# Patient Record
Sex: Male | Born: 1985 | Hispanic: Yes | Marital: Single | Smoking: Never smoker
Health system: Southern US, Community
[De-identification: ages and names within clinical notes are randomized; demographics above are authoritative.]

---

## 2009-07-22 ENCOUNTER — Emergency Department: Payer: Self-pay | Admitting: Emergency Medicine

## 2010-10-17 ENCOUNTER — Emergency Department: Payer: Self-pay | Admitting: Emergency Medicine

## 2011-03-06 ENCOUNTER — Emergency Department: Payer: Self-pay | Admitting: Emergency Medicine
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
## 2012-03-29 ENCOUNTER — Emergency Department: Payer: Self-pay | Admitting: *Deleted

## 2014-06-05 ENCOUNTER — Emergency Department: Payer: Self-pay | Admitting: Emergency Medicine

## 2014-06-05 LAB — URINALYSIS, COMPLETE
BACTERIA: NONE SEEN
Bilirubin,UR: NEGATIVE
Blood: NEGATIVE
GLUCOSE, UR: NEGATIVE mg/dL (ref 0–75)
Ketone: NEGATIVE
Leukocyte Esterase: NEGATIVE
Nitrite: NEGATIVE
Ph: 5 (ref 4.5–8.0)
Protein: NEGATIVE
RBC,UR: 3 /HPF (ref 0–5)
Specific Gravity: 1.018 (ref 1.003–1.030)
Squamous Epithelial: NONE SEEN
WBC UR: 18 /HPF (ref 0–5)

## 2014-06-05 LAB — GC/CHLAMYDIA PROBE AMP

## 2015-09-06 ENCOUNTER — Emergency Department: Payer: Self-pay

## 2015-09-06 ENCOUNTER — Encounter: Payer: Self-pay | Admitting: Emergency Medicine

## 2015-09-06 ENCOUNTER — Emergency Department
Admission: EM | Admit: 2015-09-06 | Discharge: 2015-09-06 | Disposition: A | Discharge status: Home / Self Care | Payer: Self-pay | Attending: Emergency Medicine | Admitting: Emergency Medicine

## 2015-09-06 DIAGNOSIS — K920 Hematemesis: Secondary | ICD-10-CM

## 2015-09-06 DIAGNOSIS — F1110 Opioid abuse, uncomplicated: Secondary | ICD-10-CM | POA: Insufficient documentation

## 2015-09-06 DIAGNOSIS — K2921 Alcoholic gastritis with bleeding: Secondary | ICD-10-CM | POA: Insufficient documentation

## 2015-09-06 DIAGNOSIS — K2920 Alcoholic gastritis without bleeding: Secondary | ICD-10-CM

## 2015-09-06 DIAGNOSIS — R809 Proteinuria, unspecified: Secondary | ICD-10-CM | POA: Insufficient documentation

## 2015-09-06 LAB — CBC WITH DIFFERENTIAL/PLATELET
Basophils Absolute: 0 10*3/uL (ref 0–0.1)
Basophils Relative: 1 %
Eosinophils Absolute: 0.1 10*3/uL (ref 0–0.7)
Eosinophils Relative: 1 %
HEMATOCRIT: 47 % (ref 40.0–52.0)
HEMOGLOBIN: 16.2 g/dL (ref 13.0–18.0)
LYMPHS ABS: 1.9 10*3/uL (ref 1.0–3.6)
LYMPHS PCT: 34 %
MCH: 33 pg (ref 26.0–34.0)
MCHC: 34.4 g/dL (ref 32.0–36.0)
MCV: 95.8 fL (ref 80.0–100.0)
MONOS PCT: 11 %
Monocytes Absolute: 0.6 10*3/uL (ref 0.2–1.0)
NEUTROS ABS: 3 10*3/uL (ref 1.4–6.5)
NEUTROS PCT: 53 %
Platelets: 260 10*3/uL (ref 150–440)
RBC: 4.91 MIL/uL (ref 4.40–5.90)
RDW: 13.2 % (ref 11.5–14.5)
WBC: 5.7 10*3/uL (ref 3.8–10.6)

## 2015-09-06 LAB — APTT: aPTT: 30 seconds (ref 24–36)

## 2015-09-06 LAB — COMPREHENSIVE METABOLIC PANEL
ALT: 30 U/L (ref 17–63)
ANION GAP: 7 (ref 5–15)
AST: 23 U/L (ref 15–41)
Albumin: 4.5 g/dL (ref 3.5–5.0)
Alkaline Phosphatase: 73 U/L (ref 38–126)
BUN: 8 mg/dL (ref 6–20)
CHLORIDE: 105 mmol/L (ref 101–111)
CO2: 28 mmol/L (ref 22–32)
CREATININE: 1.05 mg/dL (ref 0.61–1.24)
Calcium: 9 mg/dL (ref 8.9–10.3)
GFR calc non Af Amer: >60 mL/min (ref >60)
Glucose, Bld: 95 mg/dL (ref 65–99)
Potassium: 4.1 mmol/L (ref 3.5–5.1)
SODIUM: 140 mmol/L (ref 135–145)
Total Bilirubin: 0.6 mg/dL (ref 0.3–1.2)
Total Protein: 7.8 g/dL (ref 6.5–8.1)

## 2015-09-06 LAB — URINALYSIS COMPLETE WITH MICROSCOPIC (ARMC ONLY)
Bilirubin Urine: NEGATIVE
Glucose, UA: NEGATIVE mg/dL
KETONES UR: NEGATIVE mg/dL
Leukocytes, UA: NEGATIVE
Nitrite: NEGATIVE
PH: 5 (ref 5.0–8.0)
PROTEIN: 30 mg/dL — AB
SQUAMOUS EPITHELIAL / LPF: NONE SEEN
Specific Gravity, Urine: 1.02 (ref 1.005–1.030)

## 2015-09-06 LAB — FIBRIN DERIVATIVES D-DIMER (ARMC ONLY): FIBRIN DERIVATIVES D-DIMER (ARMC): 120 (ref 0–499)

## 2015-09-06 LAB — PROTIME-INR
INR: 0.98
PROTHROMBIN TIME: 13.2 s (ref 11.4–15.0)

## 2015-09-06 LAB — URINE DRUG SCREEN, QUALITATIVE (ARMC ONLY)
AMPHETAMINES, UR SCREEN: NOT DETECTED
BENZODIAZEPINE, UR SCRN: NOT DETECTED
Barbiturates, Ur Screen: NOT DETECTED
Cannabinoid 50 Ng, Ur ~~LOC~~: NOT DETECTED
Cocaine Metabolite,Ur ~~LOC~~: NOT DETECTED
MDMA (Ecstasy)Ur Screen: NOT DETECTED
Methadone Scn, Ur: NOT DETECTED
Opiate, Ur Screen: POSITIVE — AB
PHENCYCLIDINE (PCP) UR S: NOT DETECTED
TRICYCLIC, UR SCREEN: NOT DETECTED

## 2015-09-06 LAB — LIPASE, BLOOD: Lipase: 21 U/L (ref 11–51)

## 2015-09-06 MED ORDER — PANTOPRAZOLE SODIUM 40 MG PO TBEC: 40.0000 mg | DELAYED_RELEASE_TABLET | Freq: Every day | ORAL | Status: AC

## 2015-09-06 MED ORDER — GI COCKTAIL ~~LOC~~
30.0000 mL | Freq: Once | ORAL | Status: AC
  Administered 2015-09-06: 30 mL via ORAL
  Filled 2015-09-06: qty 30

## 2015-09-06 MED ORDER — PANTOPRAZOLE SODIUM 40 MG IV SOLR
40.0000 mg | Freq: Once | INTRAVENOUS | Status: AC
  Administered 2015-09-06: 40 mg via INTRAVENOUS
  Filled 2015-09-06: qty 40

## 2015-09-06 MED ORDER — MORPHINE SULFATE (PF) 4 MG/ML IV SOLN
4.0000 mg | Freq: Once | INTRAVENOUS | Status: AC
  Administered 2015-09-06: 4 mg via INTRAVENOUS
  Filled 2015-09-06: qty 1

## 2015-09-06 MED ORDER — SODIUM CHLORIDE 0.9 % IV BOLUS (SEPSIS)
1000.0000 mL | Freq: Once | INTRAVENOUS | Status: AC
  Administered 2015-09-06: 1000 mL via INTRAVENOUS

## 2015-09-06 NOTE — Discharge Instructions (Signed)
Gastritis - Adultos   (Gastritis, Adult)   Gastrittis es la hinchazón e irritación (inflamación) del revestimiento interno del estómago. Si no recibe tratamiento, la gastritis puede causar sangrado y llagas.(úlceras) en el estómago.  CUIDADOS EN EL HOGAR   · Sólo tome los medicamentos según le indique el médico.  · Si le han recetado antibióticos, tómelos según las indicaciones. Termine de tomar el medicamento, aunque comience a sentirse mejor.  · Beba gran cantidad de líquido para mantener el pis (orina) de tono claro o amarillo pálido.  · Evite las comidas y bebidas que empeoran los problemas. Los alimentos que debe evitar son:  ¨ Cafeína y alcohol.  ¨ Chocolate.  ¨ Menta.  ¨ Ajo y cebolla.  ¨ Comidas muy condimentadas.  ¨ Cítricos como naranjas, limones o limas.  ¨ Alimentos que contengan tomate, como salsas, chile y pizza.  ¨ Alimentos fritos y grasos.  · Haga comidas pequeñas durante el día en lugar de 3 comidas abundantes.  SOLICITE AYUDA DE INMEDIATO SI:   · La materia fecal (heces)es negra o de color rojo oscuro.  · Vomita sangre. Puede ser similar a la borra del café  · No puede retener los líquidos.  · El dolor en el vientre (abdominal) empeora.  · Tiene fiebre.  · No mejora luego de 1 semana.  · Tiene preguntas o preocupaciones.  ASEGÚRESE DE QUE:   · Comprende estas instrucciones.  · Controlará su enfermedad.  · Solicitará ayuda de inmediato si no mejora o si empeora.     Esta información no tiene como fin reemplazar el consejo del médico. Asegúrese de hacerle al médico cualquier pregunta que tenga.     Document Released: 04/27/2012  Elsevier Interactive Patient Education ©2016 [employer] Inc.

## 2015-09-06 NOTE — ED Provider Notes (Signed)
[facility] Emergency Department Provider Note  ____________________________________________   I have reviewed the triage vital signs and the nursing notes.   HISTORY  Chief Complaint Hemoptysis    HPI Anant Agard is a 29 y.o. male who drinks every day, 5-6 beers and 12 beers on the weekend who presents today complaining of having thrown up blood this morning. He did drink last night. He had a small amount of blood when he woke up. He was not hemoptysis. He was actually vomiting. He has had no melena bright red blood per rectum, he does have epigastric pain associated with this. He denies any fever or chills numbness or weakness. He has not had this before. No easy bleeding or bruisability. No history of blood dyscrasia or low platelets in his life. He has had no rash or petechial rash. He denies any fever or chills. He has not had a cough. The patient does have chronic low back pain which is been acting up recently, but that it seemed to be unrelated to this event this morning. He has never had DTs or withdrawal. He is not interested in counseling or therapy  History reviewed. No pertinent past medical history.  There are no active problems to display for this patient.   History reviewed. No pertinent past surgical history.  No current outpatient prescriptions on file.  Allergies Review of patient's allergies indicates not on file.  History reviewed. No pertinent family history.  Social History Social History  Substance Use Topics  . Smoking status: Never Smoker   . Smokeless tobacco: None  . Alcohol Use: Yes    Review of Systems Constitutional: No fever/chills Eyes: No visual changes. ENT: No sore throat. No stiff neck no neck pain Cardiovascular: Denies chest pain. Respiratory: Denies shortness of breath. Gastrointestinal:   no vomiting.  No diarrhea.  No constipation. Genitourinary: Negative for dysuria. Musculoskeletal: Negative  lower extremity swelling Skin: Negative for rash. Neurological: Negative for headaches, focal weakness or numbness. 10-point ROS otherwise negative.  ____________________________________________   PHYSICAL EXAM:  VITAL SIGNS: ED Triage Vitals  Enc Vitals Group     BP 09/06/15 1011 143/86 mmHg     Pulse Rate 09/06/15 1011 92     Resp 09/06/15 1011 20     Temp 09/06/15 1011 98 F (36.7 C)     Temp Source 09/06/15 1011 Oral     SpO2 09/06/15 1011 97 %     Weight 09/06/15 1011 165 lb (74.844 kg)     Height 09/06/15 1011  (1.651 m)     Head Cir --      Peak Flow --      Pain Score 09/06/15 1011 10     Pain Loc --      Pain Edu? --      Excl. in GC? --     Constitutional: Alert and oriented. Well appearing and in no acute distress. Eyes: Conjunctivae are normal. PERRL. EOMI. Head: Atraumatic. Nose: No congestion/rhinnorhea. Mouth/Throat: Mucous membranes are moist.  Oropharynx non-erythematous. Neck: No stridor.   Nontender with no meningismus Cardiovascular: Normal rate, regular rhythm. Grossly normal heart sounds.  Good peripheral circulation. Respiratory: Normal respiratory effort.  No retractions. Lungs CTAB. Gastrointestinal: Minimally tender to the epigastric region. No distention. No guarding no rebound Back:  There is no focal or step off there is no midline tenderness there are no lesions noted. [facility]: Guaiac-negative brown stool GU: Normal external male genitalia  no hernia, no lesions Musculoskeletal: No lower extremity tenderness. No joint effusions, no DVT signs strong distal pulses no edema Neurologic:  Normal speech and language. No gross focal neurologic deficits are appreciated.  Skin:  Skin is warm, dry and intact. No rash noted. Psychiatric: Mood and affect are normal. Speech and behavior are normal.  ____________________________________________   LABS (all labs ordered are listed, but only abnormal results are  displayed)  Labs Reviewed  CBC WITH DIFFERENTIAL/PLATELET  COMPREHENSIVE METABOLIC PANEL  LIPASE, BLOOD  PROTIME-INR  APTT  FIBRIN DERIVATIVES D-DIMER ([facility] ONLY)  URINALYSIS COMPLETEWITH MICROSCOPIC ([facility] ONLY)  URINE DRUG SCREEN, QUALITATIVE ([facility] ONLY)   ____________________________________________  EKG I personally interpreted EKG normal sinus rhythm rate 82 bpm R s R prime configuration no acute ST elevation or acute ST depression normal axis unremarkable EKG  ____________________________________________  RADIOLOGY  I have reviewed ____________________________________________   PROCEDURES  Procedure(s) performed: None  Critical Care performed: None  ____________________________________________   INITIAL IMPRESSION / ASSESSMENT AND PLAN / ED COURSE  Pertinent labs & imaging results that were available during my care of the patient were reviewed by me and considered in my medical decision making (see chart for details).  Patient with significant alcohol abuse presents with a single episode of hematemesis. He has not vomited since. He gave a GI cocktail and all his pain went away. He has completely normal abdominal exams at this time with no evidence of surgical pathology. He is guaiac-negative with no evidence of significant GI bleed. 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 I have given him omeprazole as well. We'll send him on antiacids and counseled him extensively about drinking alcohol, as well as return precautions for any bleeding or worsening symptoms. Patient otherwise quite well-appearing at this time.  We will check a urine and she states that he did have low back pain. ____________________________________________   FINAL CLINICAL IMPRESSION(S) / ED DIAGNOSES  Final diagnoses:  None     Jeanmarie PlantJames A McShane, MD 09/06/15 1547

## 2015-09-06 NOTE — ED Notes (Signed)
Pt verbalized understanding of discharge instructions. NAD 

## 2015-09-06 NOTE — ED Notes (Addendum)
Pt to ed with c/o cough x 2 weeks,  Pt states "I feel like I may have pulled a muscle across my stomach and chest".  Reports productive bloody cough today. Mask placed on pt at triage.  Pt skin warm and dry and pt appears in no acute respiratory distress.

## 2015-09-08 LAB — URINE CULTURE: Culture: NO GROWTH

## 2016-10-15 IMAGING — CR DG CHEST 2V
2 series · 2 of 2 positions shown · non-contrast
Comparison: None.

CLINICAL DATA: Chest pain.  Single episode of hemoptysis

EXAM:
CHEST  2 VIEW

[chest pa]
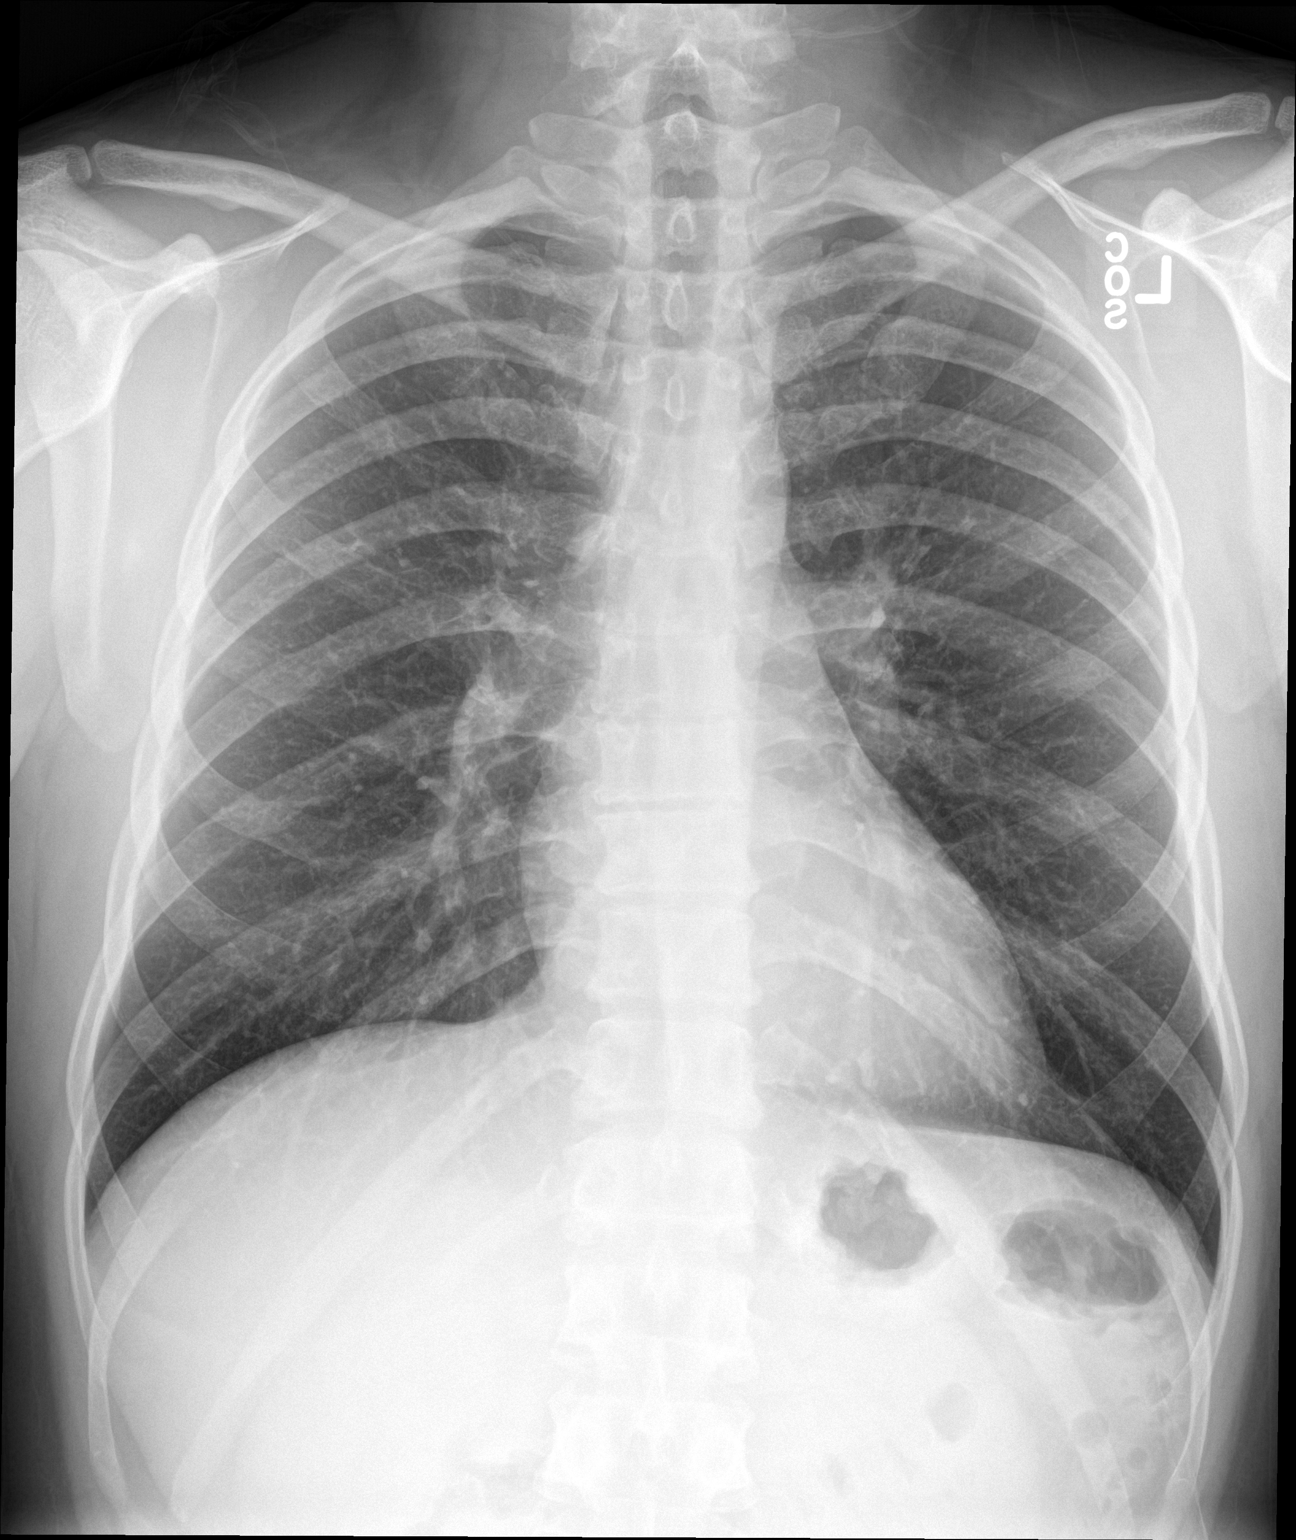

[chest lat]
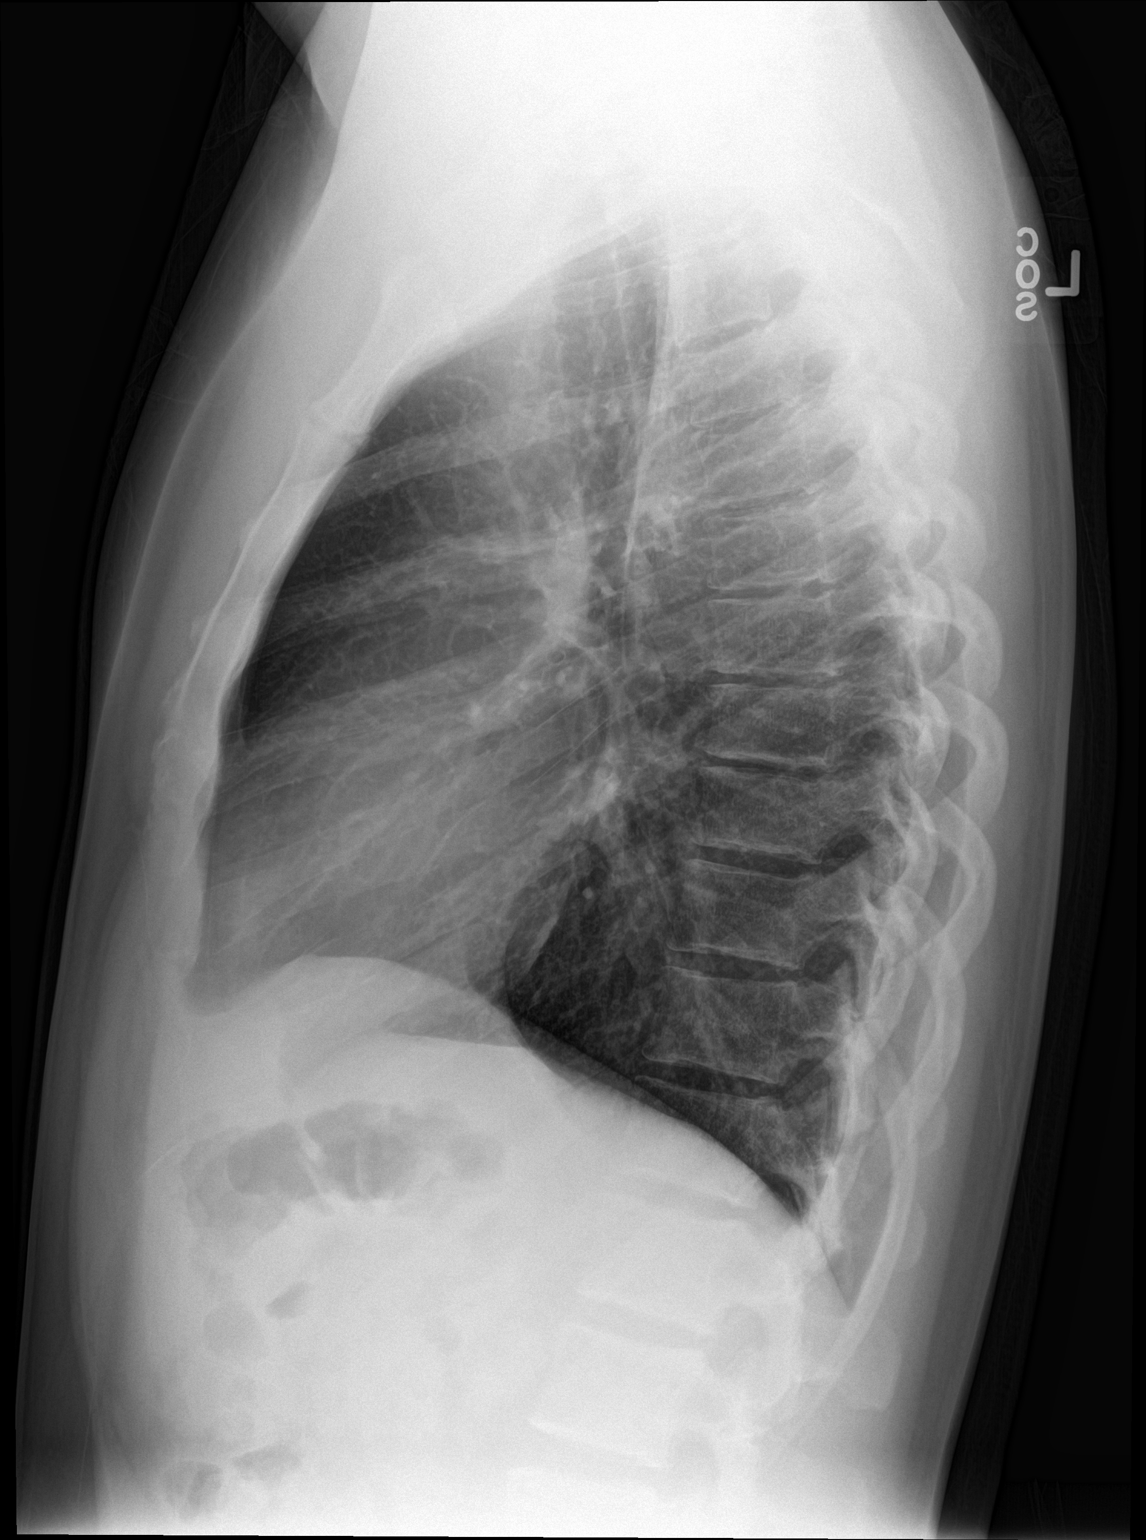

[2 of 2 positions shown; findings below may reference images not displayed]

FINDINGS: Lungs are clear. Heart size and pulmonary vascularity are normal. No
adenopathy. No pneumothorax. No bone lesions.
IMPRESSION: No abnormality noted.

## 2020-10-05 ENCOUNTER — Other Ambulatory Visit: Payer: Self-pay

## 2020-10-05 ENCOUNTER — Emergency Department
Admission: EM | Admit: 2020-10-05 | Discharge: 2020-10-05 | Disposition: A | Discharge status: Home / Self Care | Payer: HRSA Program | Attending: Emergency Medicine | Admitting: Emergency Medicine

## 2020-10-05 DIAGNOSIS — U071 COVID-19: Secondary | ICD-10-CM | POA: Insufficient documentation

## 2020-10-05 DIAGNOSIS — R509 Fever, unspecified: Secondary | ICD-10-CM | POA: Diagnosis present

## 2020-10-05 LAB — RESP PANEL BY RT-PCR (FLU A&B, COVID) ARPGX2
Influenza A by PCR: NEGATIVE
Influenza B by PCR: NEGATIVE
SARS Coronavirus 2 by RT PCR: POSITIVE — AB

## 2020-10-05 MED ORDER — GUAIFENESIN-CODEINE 100-10 MG/5ML PO SYRP: 5.0000 mL | ORAL_SOLUTION | Freq: Three times a day (TID) | ORAL | 0 refills | Status: AC | PRN

## 2020-10-05 MED ORDER — KETOROLAC TROMETHAMINE 30 MG/ML IJ SOLN
15.0000 mg | Freq: Once | INTRAMUSCULAR | Status: AC
Start: 2020-10-05 — End: 2020-10-05
  Administered 2020-10-05: 15 mg via INTRAVENOUS
  Filled 2020-10-05: qty 1

## 2020-10-05 MED ORDER — ACETAMINOPHEN 325 MG PO TABS
650.0000 mg | ORAL_TABLET | Freq: Once | ORAL | Status: AC
  Administered 2020-10-05: 650 mg via ORAL
  Filled 2020-10-05: qty 2

## 2020-10-05 MED ORDER — PREDNISONE 10 MG (21) PO TBPK: ORAL_TABLET | ORAL | 0 refills | Status: AC

## 2020-10-05 MED ORDER — SODIUM CHLORIDE 0.9 % IV BOLUS
1000.0000 mL | Freq: Once | INTRAVENOUS | Status: AC
  Administered 2020-10-05: 1000 mL via INTRAVENOUS

## 2020-10-05 NOTE — ED Triage Notes (Signed)
Pt comes via POV from home with c/o possible COVID. Pt states he has loss of smell and taste, fever and generalized aches. Pt states diarrhea as well.

## 2020-10-05 NOTE — Discharge Instructions (Addendum)
Please follow up with Primary Care for symptoms that do not improve over the next week or so.  Return to the ER for symptoms that change or worsen if unable to schedule an appointment.

## 2020-10-05 NOTE — ED Provider Notes (Signed)
[facility] Emergency Department Provider Note ____________________________________________   First MD Initiated Contact with Patient 10/05/20 1651     (approximate)  I have reviewed the triage vital signs and the nursing notes.   HISTORY  Chief Complaint Fever and Diarrhea  HPI Artem Bunte is a 34 y.o. male with no chronic medical history presents to the emergency department for treatment and evaluation of COVID-19 symptoms after being exposed by a co-worker. No alleviating measures prior to arrival.          History reviewed. No pertinent past medical history.  There are no problems to display for this patient.   History reviewed. No pertinent surgical history.  Prior to Admission medications   Medication Sig Start Date End Date Taking? Authorizing Provider  guaiFENesin-codeine (ROBITUSSIN AC) 100-10 MG/5ML syrup Take 5 mLs by mouth 3 (three) times daily as needed for cough. 10/05/20   Kirstan Fentress, Rulon Eisenmenger B, FNP  pantoprazole (PROTONIX) 40 MG tablet Take 1 tablet (40 mg total) by mouth daily. 09/06/15 09/05/16  Jeanmarie Plant, MD  predniSONE (STERAPRED UNI-PAK 21 TAB) 10 MG (21) TBPK tablet Take 6 tablets on the first day and decrease by 1 tablet each day until finished. 10/05/20   Chinita Pester, FNP    Allergies Patient has no allergy information on record.  No family history on file.  Social History Social History   Tobacco Use  . Smoking status: Never Smoker  Substance Use Topics  . Alcohol use: Yes  . Drug use: No    Review of Systems  Constitutional: No fever/chills Eyes: No visual changes. ENT: No sore throat. Cardiovascular: Denies chest pain. Respiratory: Denies shortness of breath. Gastrointestinal: No abdominal pain.  No nausea, no vomiting.  Positive for diarrhea.  No constipation. Genitourinary: Negative for dysuria. Musculoskeletal: Positive for generalized body aches Skin: Negative for rash. Neurological:  Positive for headaches, negatiave focal weakness or numbness. ___________________________________________   PHYSICAL EXAM:  VITAL SIGNS: ED Triage Vitals [10/05/20 1511]  Enc Vitals Group     BP 118/73     Pulse Rate (!) 101     Resp 18     Temp (!) 100.7 F (38.2 C)     Temp src      SpO2 99 %     Weight 180 lb (81.6 kg)     Height 5\' 7"  (1.702 m)     Head Circumference      Peak Flow      Pain Score 4     Pain Loc      Pain Edu?      Excl. in GC?     Constitutional: Alert and oriented. Acutely ill appearing and in no acute distress. Eyes: Conjunctivae are normal. Head: Atraumatic. Nose: No congestion/rhinnorhea. Mouth/Throat: Mucous membranes are moist.  Oropharynx erythematous. Neck: No stridor.   Hematological/Lymphatic/Immunilogical: No cervical lymphadenopathy. Cardiovascular: Normal rate, regular rhythm. Grossly normal heart sounds.  Good peripheral circulation. Respiratory: Normal respiratory effort.  No retractions. Lungs CTAB. Gastrointestinal: Soft and nontender.  Genitourinary:  Musculoskeletal: No lower extremity tenderness nor edema.  Neurologic:  Normal speech and language. No gross focal neurologic deficits are appreciated. No gait instability. Skin:  Skin is warm, dry and intact. No rash noted. Psychiatric: Mood and affect are normal. Speech and behavior are normal.  ____________________________________________   LABS (all labs ordered are listed, but only abnormal results are displayed)  Labs Reviewed  RESP PANEL BY RT-PCR (FLU A&B, COVID) ARPGX2 - Abnormal; Notable  for the following components:      Result Value   SARS Coronavirus 2 by RT PCR POSITIVE (*)    All other components within normal limits   ____________________________________________  EKG  Not indicated. ____________________________________________  RADIOLOGY  ED MD interpretation:    Not indicated.  I, Kem Boroughs, personally viewed and evaluated these images (plain  radiographs) as part of my medical decision making, as well as reviewing the written report by the radiologist.  Official radiology report(s): No results found.  ____________________________________________   PROCEDURES  Procedure(s) performed (including Critical Care):  Procedures  ____________________________________________   INITIAL IMPRESSION / ASSESSMENT AND PLAN     34 year old male with significant COVID exposure presents with symptoms as in HPI. Will give fluids and test.  DIFFERENTIAL DIAGNOSIS  COVID, influenza, viral syndrome  ED COURSE  COVID test positive. Oxygen saturation 99% on room air. No longer tachycardic after fluids. Will discharge home with prednisone and quarantine instructions/work excuse.    ___________________________________________   FINAL CLINICAL IMPRESSION(S) / ED DIAGNOSES  Final diagnoses:  COVID-19     ED Discharge Orders         Ordered    predniSONE (STERAPRED UNI-PAK 21 TAB) 10 MG (21) TBPK tablet        10/05/20 1856    guaiFENesin-codeine (ROBITUSSIN AC) 100-10 MG/5ML syrup  3 times daily PRN        10/05/20 1856           Avrohom Javontay Vandam was evaluated in Emergency Department on 10/05/2020 for the symptoms described in the history of present illness. He was evaluated in the context of the global COVID-19 pandemic, which necessitated consideration that the patient might be at risk for infection with the SARS-CoV-2 virus that causes COVID-19. Institutional protocols and algorithms that pertain to the evaluation of patients at risk for COVID-19 are in a state of rapid change based on information released by regulatory bodies including the CDC and federal and state organizations. These policies and algorithms were followed during the patient's care in the ED.   Note:  This document was prepared using Dragon voice recognition software and may include unintentional dictation errors.   Chinita Pester, FNP 10/05/20  2055    Shaune Pollack, MD 10/12/20 1201

## 2020-10-06 ENCOUNTER — Telehealth: Payer: Self-pay | Admitting: Physician Assistant

## 2020-10-06 ENCOUNTER — Other Ambulatory Visit: Payer: Self-pay | Admitting: Physician Assistant

## 2020-10-06 DIAGNOSIS — U071 COVID-19: Secondary | ICD-10-CM

## 2020-10-06 DIAGNOSIS — Z6828 Body mass index (BMI) 28.0-28.9, adult: Secondary | ICD-10-CM

## 2020-10-06 NOTE — Telephone Encounter (Signed)
Called to discuss with patient about Covid symptoms and the use of sotrovimab, bamlanivimab/etesevimab or casirivimab/imdevimab, a monoclonal antibody infusion for those with mild to moderate Covid symptoms and at a high risk of hospitalization.  Pt is qualified for this infusion at the [place] infusion center due to; Specific high risk criteria : BMI > 25 and Other high risk medical condition per CDC:  high SVI   Message left to call back our hotline [phone]. My chart message sent if active on Mychart.   Jakell Trusty PA-C  

## 2020-10-06 NOTE — Progress Notes (Signed)
I connected by phone with Roberto Alvarado on 10/06/2020 at 2:32 PM to discuss the potential use of a new treatment for mild to moderate COVID-19 viral infection in non-hospitalized patients.  This patient is a 34 y.o. male that meets the FDA criteria for Emergency Use Authorization of COVID monoclonal antibody sotrovimab, casirivimab/imdevimab or bamlamivimab/estevimab.  Has a (+) direct SARS-CoV-2 viral test result  Has mild or moderate COVID-19   Is NOT hospitalized due to COVID-19  Is within 10 days of symptom onset  Has at least one of the high risk factor(s) for progression to severe COVID-19 and/or hospitalization as defined in EUA.  Specific high risk criteria : BMI > 25 and Other high risk medical condition per CDC:  high SVI   I have spoken and communicated the following to the patient or parent/caregiver regarding COVID monoclonal antibody treatment:  1. FDA has authorized the emergency use for the treatment of mild to moderate COVID-19 in adults and pediatric patients with positive results of direct SARS-CoV-2 viral testing who are 53 years of age and older weighing at least 40 kg, and who are at high risk for progressing to severe COVID-19 and/or hospitalization.  2. The significant known and potential risks and benefits of COVID monoclonal antibody, and the extent to which such potential risks and benefits are unknown.  3. Information on available alternative treatments and the risks and benefits of those alternatives, including clinical trials.  4. Patients treated with COVID monoclonal antibody should continue to self-isolate and use infection control measures (e.g., wear mask, isolate, social distance, avoid sharing personal items, clean and disinfect "high touch" surfaces, and frequent handwashing) according to CDC guidelines.   5. The patient or parent/caregiver has the option to accept or refuse COVID monoclonal antibody treatment.  After reviewing this  information with the patient, the patient has agreed to receive one of the available covid 19 monoclonal antibodies and will be provided an appropriate fact sheet prior to infusion.  Sx onset 11/24. Set up for infusion on 11/29 @ 10:30am. Directions given to [facility]. Pt is aware that insurance will be charged an infusion fee (pt is uninsured). Pt is unvaccinated.   [name] 10/06/2020 2:32 PM

## 2020-10-08 ENCOUNTER — Telehealth (HOSPITAL_COMMUNITY): Payer: Self-pay

## 2020-10-08 ENCOUNTER — Ambulatory Visit (HOSPITAL_COMMUNITY): Payer: Self-pay

## 2020-10-08 NOTE — Telephone Encounter (Signed)
I called patient to verify his appointment today at 10:30 with the monoclonal infusion center.  Patient states he never received phone for appointment time. He states he feels better today, no longer with diarrhea or fever. He wished to cancel appointment since "I live in [place] and I don't want to risk anyone else getting sick".  I gave patient covid clinic numbers in case he wishes to reschedule. Let patient know there was a 10 day window. All questions answered.
